# Patient Record
Sex: Male | Born: 1935 | Race: White | Hispanic: No | Marital: Married
Health system: Southern US, Community
[De-identification: ages and names within clinical notes are randomized; demographics above are authoritative.]

---

## 2003-05-14 ENCOUNTER — Other Ambulatory Visit: Payer: Self-pay

## 2003-08-10 ENCOUNTER — Other Ambulatory Visit: Payer: Self-pay

## 2003-11-05 ENCOUNTER — Other Ambulatory Visit: Payer: Self-pay

## 2004-03-04 ENCOUNTER — Ambulatory Visit: Payer: Self-pay | Admitting: Nurse Practitioner

## 2004-09-10 ENCOUNTER — Ambulatory Visit: Payer: Self-pay

## 2004-09-18 ENCOUNTER — Ambulatory Visit: Payer: Self-pay

## 2006-05-10 ENCOUNTER — Other Ambulatory Visit: Payer: Self-pay

## 2006-05-10 ENCOUNTER — Emergency Department: Payer: Self-pay | Admitting: Emergency Medicine

## 2006-05-29 ENCOUNTER — Other Ambulatory Visit: Payer: Self-pay

## 2006-05-29 ENCOUNTER — Inpatient Hospital Stay: Payer: Self-pay | Admitting: Specialist

## 2006-05-31 ENCOUNTER — Other Ambulatory Visit: Payer: Self-pay

## 2006-06-14 ENCOUNTER — Other Ambulatory Visit: Payer: Self-pay

## 2006-06-14 ENCOUNTER — Inpatient Hospital Stay: Payer: Self-pay | Admitting: Specialist

## 2009-08-27 ENCOUNTER — Emergency Department: Payer: Self-pay | Admitting: Emergency Medicine

## 2010-06-17 ENCOUNTER — Ambulatory Visit: Payer: Self-pay | Admitting: Internal Medicine

## 2010-07-07 ENCOUNTER — Inpatient Hospital Stay: Payer: Self-pay | Admitting: Internal Medicine

## 2010-07-10 ENCOUNTER — Encounter: Payer: Self-pay | Admitting: Internal Medicine

## 2010-07-18 ENCOUNTER — Ambulatory Visit: Payer: Self-pay | Admitting: Internal Medicine

## 2010-07-18 ENCOUNTER — Encounter: Payer: Self-pay | Admitting: Internal Medicine

## 2010-08-17 ENCOUNTER — Encounter: Payer: Self-pay | Admitting: Internal Medicine

## 2010-09-17 ENCOUNTER — Encounter: Payer: Self-pay | Admitting: Internal Medicine

## 2010-12-03 ENCOUNTER — Inpatient Hospital Stay: Payer: Self-pay | Admitting: Internal Medicine

## 2011-05-07 ENCOUNTER — Inpatient Hospital Stay: Payer: Self-pay | Admitting: Internal Medicine

## 2011-05-07 LAB — COMPREHENSIVE METABOLIC PANEL
Albumin: 4 g/dL (ref 3.4–5.0)
Alkaline Phosphatase: 58 U/L (ref 50–136)
Anion Gap: 13 (ref 7–16)
Bilirubin,Total: 1.2 mg/dL — ABNORMAL HIGH (ref 0.2–1.0)
Chloride: 97 mmol/L — ABNORMAL LOW (ref 98–107)
Co2: 28 mmol/L (ref 21–32)
Creatinine: 0.98 mg/dL (ref 0.60–1.30)
EGFR (African American): >60
Osmolality: 278 (ref 275–301)
Potassium: 4 mmol/L (ref 3.5–5.1)
SGOT(AST): 43 U/L — ABNORMAL HIGH (ref 15–37)
Sodium: 138 mmol/L (ref 136–145)

## 2011-05-07 LAB — CBC
Platelet: 135 10*3/uL — ABNORMAL LOW (ref 150–440)
RBC: 5.01 10*6/uL (ref 4.40–5.90)
RDW: 13 % (ref 11.5–14.5)
WBC: 14.9 10*3/uL — ABNORMAL HIGH (ref 3.8–10.6)

## 2011-05-07 LAB — URINALYSIS, COMPLETE
Bacteria: NONE SEEN
Bilirubin,UR: NEGATIVE
Glucose,UR: NEGATIVE mg/dL (ref 0–75)
Leukocyte Esterase: NEGATIVE
Nitrite: NEGATIVE
Ph: 5 (ref 4.5–8.0)
Protein: 30
RBC,UR: 5 /HPF (ref 0–5)
Specific Gravity: 1.014 (ref 1.003–1.030)
Squamous Epithelial: NONE SEEN
WBC UR: 2 /HPF (ref 0–5)

## 2011-05-07 LAB — TROPONIN I: Troponin-I: 0.02 ng/mL

## 2011-05-09 LAB — CBC WITH DIFFERENTIAL/PLATELET
Basophil #: 0 10*3/uL (ref 0.0–0.1)
Basophil %: 0 %
Eosinophil %: 0.1 %
HCT: 42.6 % (ref 40.0–52.0)
HGB: 14.2 g/dL (ref 13.0–18.0)
Lymphocyte #: 0.4 10*3/uL — ABNORMAL LOW (ref 1.0–3.6)
Lymphocyte %: 3.4 %
MCH: 32.8 pg (ref 26.0–34.0)
MCV: 98 fL (ref 80–100)
Monocyte %: 7.2 %
Neutrophil #: 11.2 10*3/uL — ABNORMAL HIGH (ref 1.4–6.5)
Platelet: 141 10*3/uL — ABNORMAL LOW (ref 150–440)
RBC: 4.33 10*6/uL — ABNORMAL LOW (ref 4.40–5.90)
RDW: 12.9 % (ref 11.5–14.5)
WBC: 12.5 10*3/uL — ABNORMAL HIGH (ref 3.8–10.6)

## 2011-05-13 LAB — CULTURE, BLOOD (SINGLE)

## 2011-05-22 ENCOUNTER — Inpatient Hospital Stay: Payer: Self-pay | Admitting: Student

## 2011-05-22 LAB — URINALYSIS, COMPLETE
Bilirubin,UR: NEGATIVE
Glucose,UR: NEGATIVE mg/dL (ref 0–75)
Leukocyte Esterase: NEGATIVE
Ph: 6 (ref 4.5–8.0)
Protein: NEGATIVE
Specific Gravity: 1.019 (ref 1.003–1.030)
Squamous Epithelial: <1

## 2011-05-22 LAB — CBC
HCT: 53.8 % — ABNORMAL HIGH (ref 40.0–52.0)
HGB: 17.8 g/dL (ref 13.0–18.0)
MCH: 32.2 pg (ref 26.0–34.0)
MCHC: 33 g/dL (ref 32.0–36.0)
MCHC: 33.3 g/dL (ref 32.0–36.0)
MCV: 98 fL (ref 80–100)
Platelet: 149 10*3/uL — ABNORMAL LOW (ref 150–440)
Platelet: 141 10*3/uL — ABNORMAL LOW (ref 150–440)
RBC: 5.51 10*6/uL (ref 4.40–5.90)
RBC: 5.03 10*6/uL (ref 4.40–5.90)
RDW: 12.4 % (ref 11.5–14.5)
RDW: 13.2 % (ref 11.5–14.5)
WBC: 13 10*3/uL — ABNORMAL HIGH (ref 3.8–10.6)

## 2011-05-22 LAB — DIFFERENTIAL
Basophil #: 0 10*3/uL (ref 0.0–0.1)
Basophil %: 0 %
Eosinophil %: 0.6 %
Lymphocyte %: 4.3 %
Monocyte #: 0.9 10*3/uL — ABNORMAL HIGH (ref 0.0–0.7)

## 2011-05-22 LAB — COMPREHENSIVE METABOLIC PANEL
Albumin: 3.3 g/dL — ABNORMAL LOW (ref 3.4–5.0)
Alkaline Phosphatase: 86 U/L (ref 50–136)
BUN: 21 mg/dL — ABNORMAL HIGH (ref 7–18)
Calcium, Total: 8.8 mg/dL (ref 8.5–10.1)
Co2: 29 mmol/L (ref 21–32)
Creatinine: 0.82 mg/dL (ref 0.60–1.30)
EGFR (African American): >60
EGFR (Non-African Amer.): >60
Glucose: 98 mg/dL (ref 65–99)
Osmolality: 282 (ref 275–301)
Sodium: 140 mmol/L (ref 136–145)
Total Protein: 7 g/dL (ref 6.4–8.2)

## 2011-05-22 LAB — TROPONIN I: Troponin-I: 0.25 ng/mL — ABNORMAL HIGH

## 2011-05-22 LAB — CK TOTAL AND CKMB (NOT AT ARMC)
CK, Total: 99 U/L (ref 35–232)
CK-MB: 2.6 ng/mL (ref 0.5–3.6)

## 2011-05-22 LAB — PRO B NATRIURETIC PEPTIDE: B-Type Natriuretic Peptide: 856 pg/mL — ABNORMAL HIGH (ref 0–450)

## 2011-05-23 LAB — BASIC METABOLIC PANEL
Anion Gap: 12 (ref 7–16)
BUN: 26 mg/dL — ABNORMAL HIGH (ref 7–18)
Calcium, Total: 8.9 mg/dL (ref 8.5–10.1)
Chloride: 102 mmol/L (ref 98–107)
EGFR (African American): >60
EGFR (Non-African Amer.): >60
Glucose: 147 mg/dL — ABNORMAL HIGH (ref 65–99)
Osmolality: 287 (ref 275–301)

## 2011-05-23 LAB — CBC WITH DIFFERENTIAL/PLATELET
Basophil #: 0 10*3/uL (ref 0.0–0.1)
Basophil %: 0.1 %
Eosinophil #: 0 10*3/uL (ref 0.0–0.7)
Eosinophil %: 0 %
HCT: 47.7 % (ref 40.0–52.0)
HGB: 16 g/dL (ref 13.0–18.0)
MCHC: 33.5 g/dL (ref 32.0–36.0)
MCV: 98 fL (ref 80–100)
Monocyte #: 0.1 10*3/uL (ref 0.0–0.7)
Neutrophil #: 8.4 10*3/uL — ABNORMAL HIGH (ref 1.4–6.5)
Neutrophil %: 96.6 %
Platelet: 141 10*3/uL — ABNORMAL LOW (ref 150–440)
RBC: 4.88 10*6/uL (ref 4.40–5.90)

## 2011-05-23 LAB — TROPONIN I: Troponin-I: 0.07 ng/mL — ABNORMAL HIGH

## 2011-05-27 LAB — BASIC METABOLIC PANEL
Anion Gap: 7 (ref 7–16)
BUN: 34 mg/dL — ABNORMAL HIGH (ref 7–18)
Co2: 31 mmol/L (ref 21–32)
EGFR (Non-African Amer.): >60
Glucose: 123 mg/dL — ABNORMAL HIGH (ref 65–99)

## 2011-05-28 LAB — CULTURE, BLOOD (SINGLE)

## 2011-05-30 LAB — CBC WITH DIFFERENTIAL/PLATELET
Eosinophil #: 0 10*3/uL (ref 0.0–0.7)
Eosinophil %: 0.1 %
HCT: 51.6 % (ref 40.0–52.0)
Lymphocyte %: 2.1 %
MCH: 32.1 pg (ref 26.0–34.0)
MCHC: 32.8 g/dL (ref 32.0–36.0)
MCV: 98 fL (ref 80–100)
Monocyte #: 0.9 x10 3/mm (ref 0.2–1.0)
Monocyte %: 6 %
Neutrophil %: 91.7 %
RDW: 12.9 % (ref 11.5–14.5)
WBC: 14.6 10*3/uL — ABNORMAL HIGH (ref 3.8–10.6)

## 2011-05-30 LAB — BASIC METABOLIC PANEL
Anion Gap: 8 (ref 7–16)
BUN: 37 mg/dL — ABNORMAL HIGH (ref 7–18)
Calcium, Total: 8.7 mg/dL (ref 8.5–10.1)
Chloride: 103 mmol/L (ref 98–107)
Creatinine: 0.94 mg/dL (ref 0.60–1.30)
EGFR (Non-African Amer.): >60
Osmolality: 291 (ref 275–301)
Sodium: 141 mmol/L (ref 136–145)

## 2011-08-17 DEATH — deceased

## 2013-03-17 IMAGING — CR DG CHEST 1V PORT
1 series · 2 of 2 positions shown · non-contrast
Comparison: none

REASON FOR EXAM: sob
COMMENTS:

[Series 1: portable · 0.17mm/px · 2 of 2 slices shown]
[im 1/2]
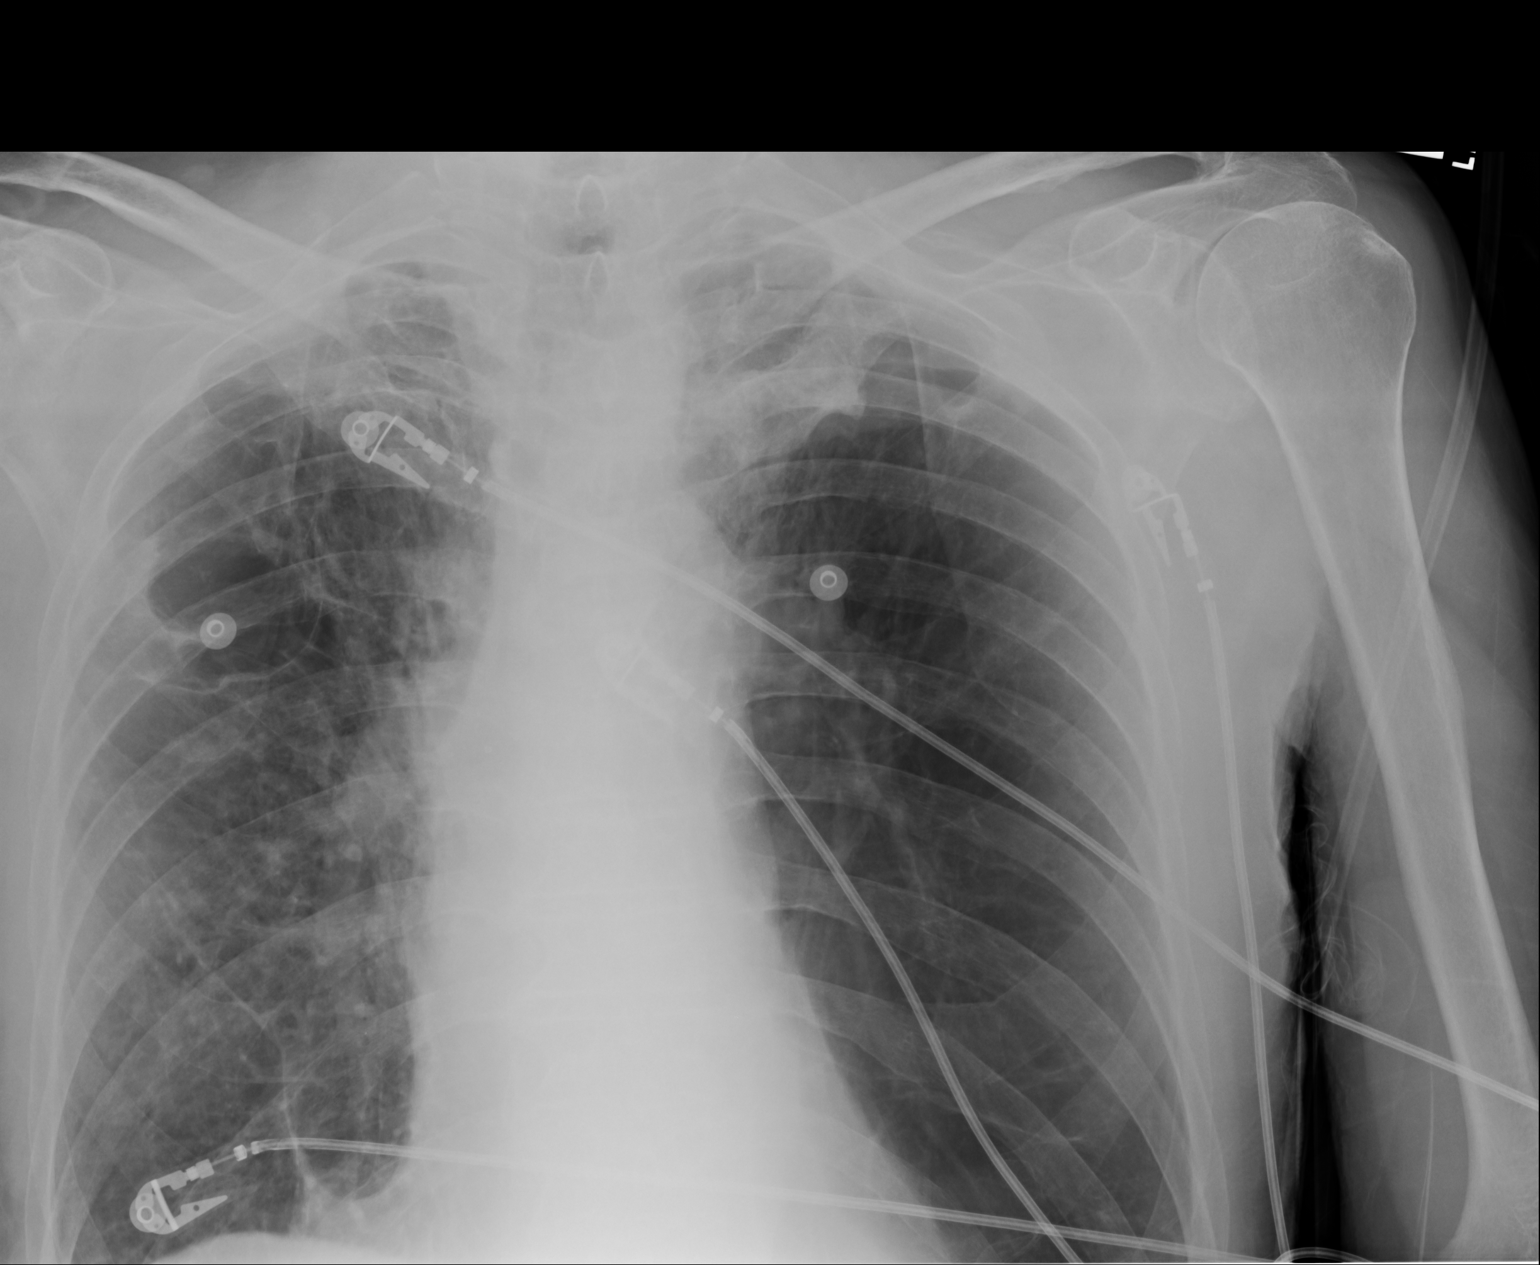
[im 2/2]
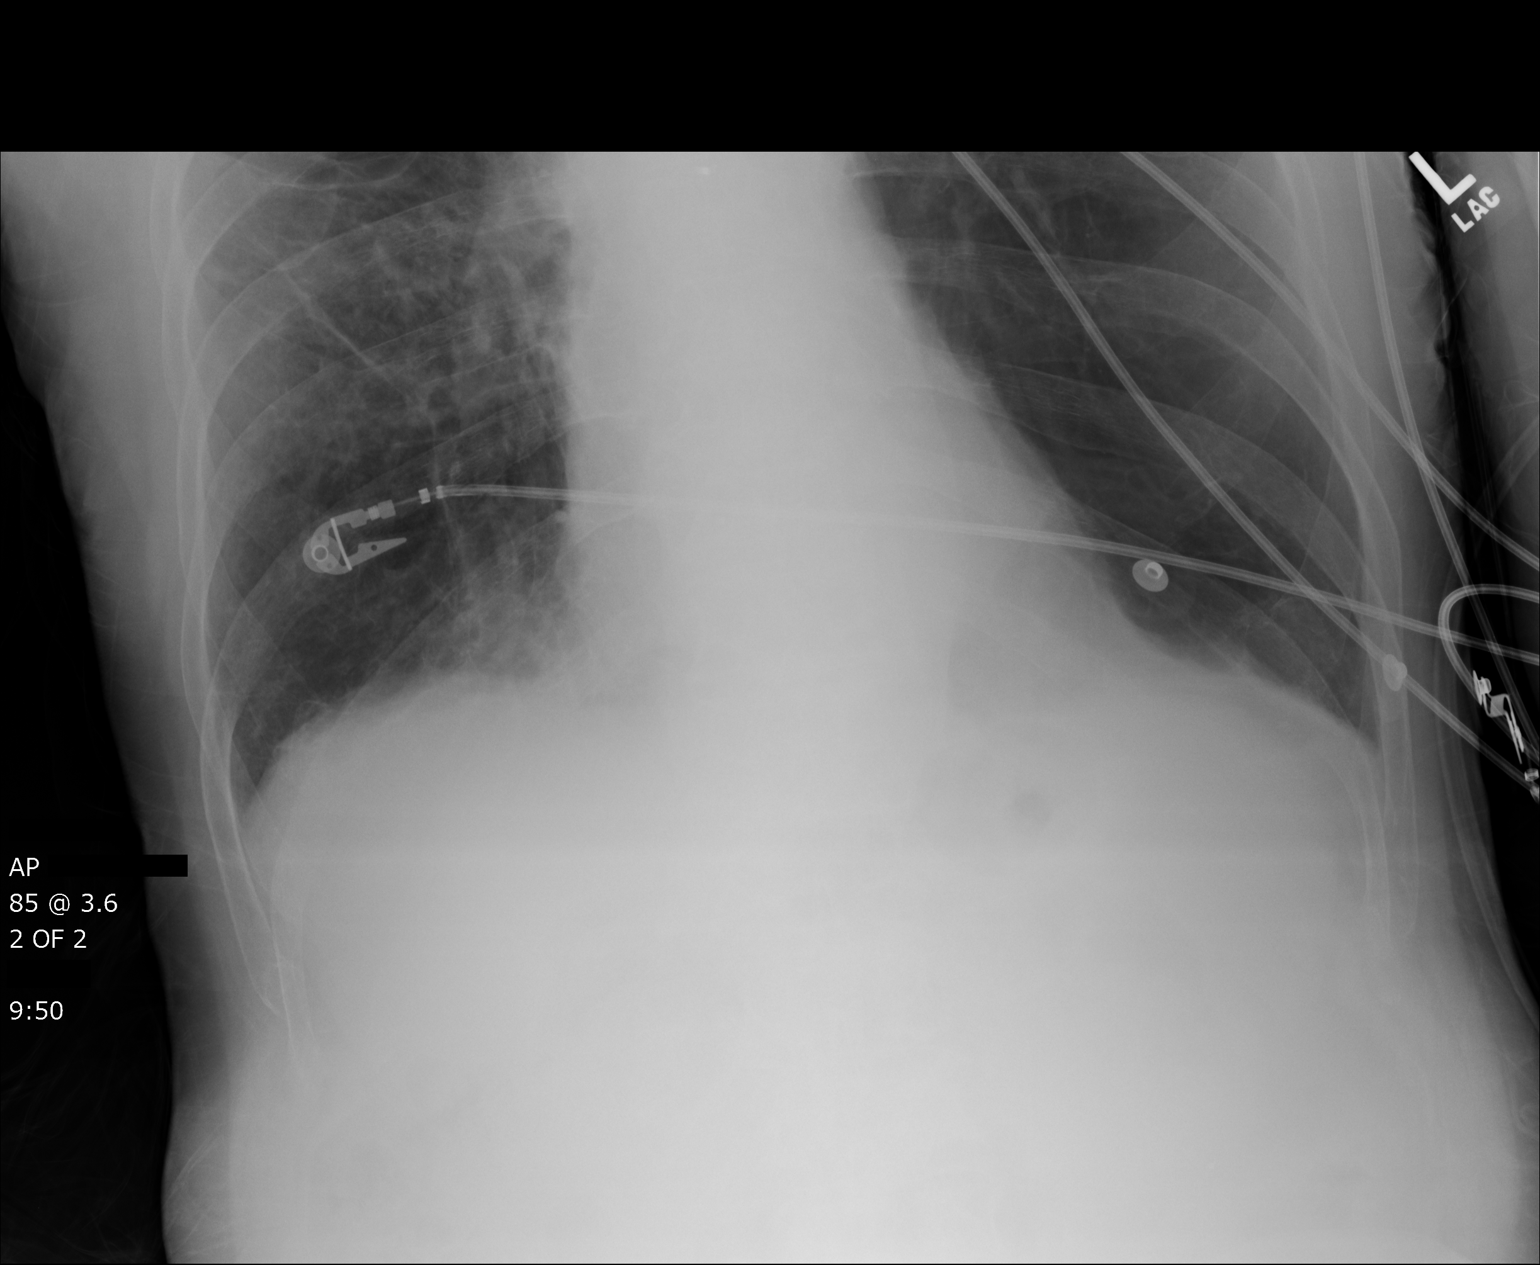

[2 of 2 positions shown; findings below may reference images not displayed]

PROCEDURE:     DXR - DXR PORTABLE CHEST SINGLE VIEW  - May 22, 2011  [DATE]

RESULT:

Eczematous change is identified within the right and left lungs as well as
bullous changes in the right lung apex. There is prominence of the
interstitial markings. No focal regions of consolidation are identified.
This study was compared to a previous study dated 05/07/2011. Cardiac
silhouette and visualized bony skeleton are unremarkable.
IMPRESSION: 1.     Emphysematous and likely fibrotic changes within the lungs.
2.     No focal regions of consolidation are appreciated.

## 2013-03-18 IMAGING — CR DG CHEST 1V PORT
1 series · 1 of 1 positions shown · non-contrast
Comparison: none

REASON FOR EXAM: resp failure
COMMENTS:

PROCEDURE:     DXR - DXR PORTABLE CHEST SINGLE VIEW  - May 23, 2011  [DATE]
RESULT:     Comparison: 05/22/2011

[portable]
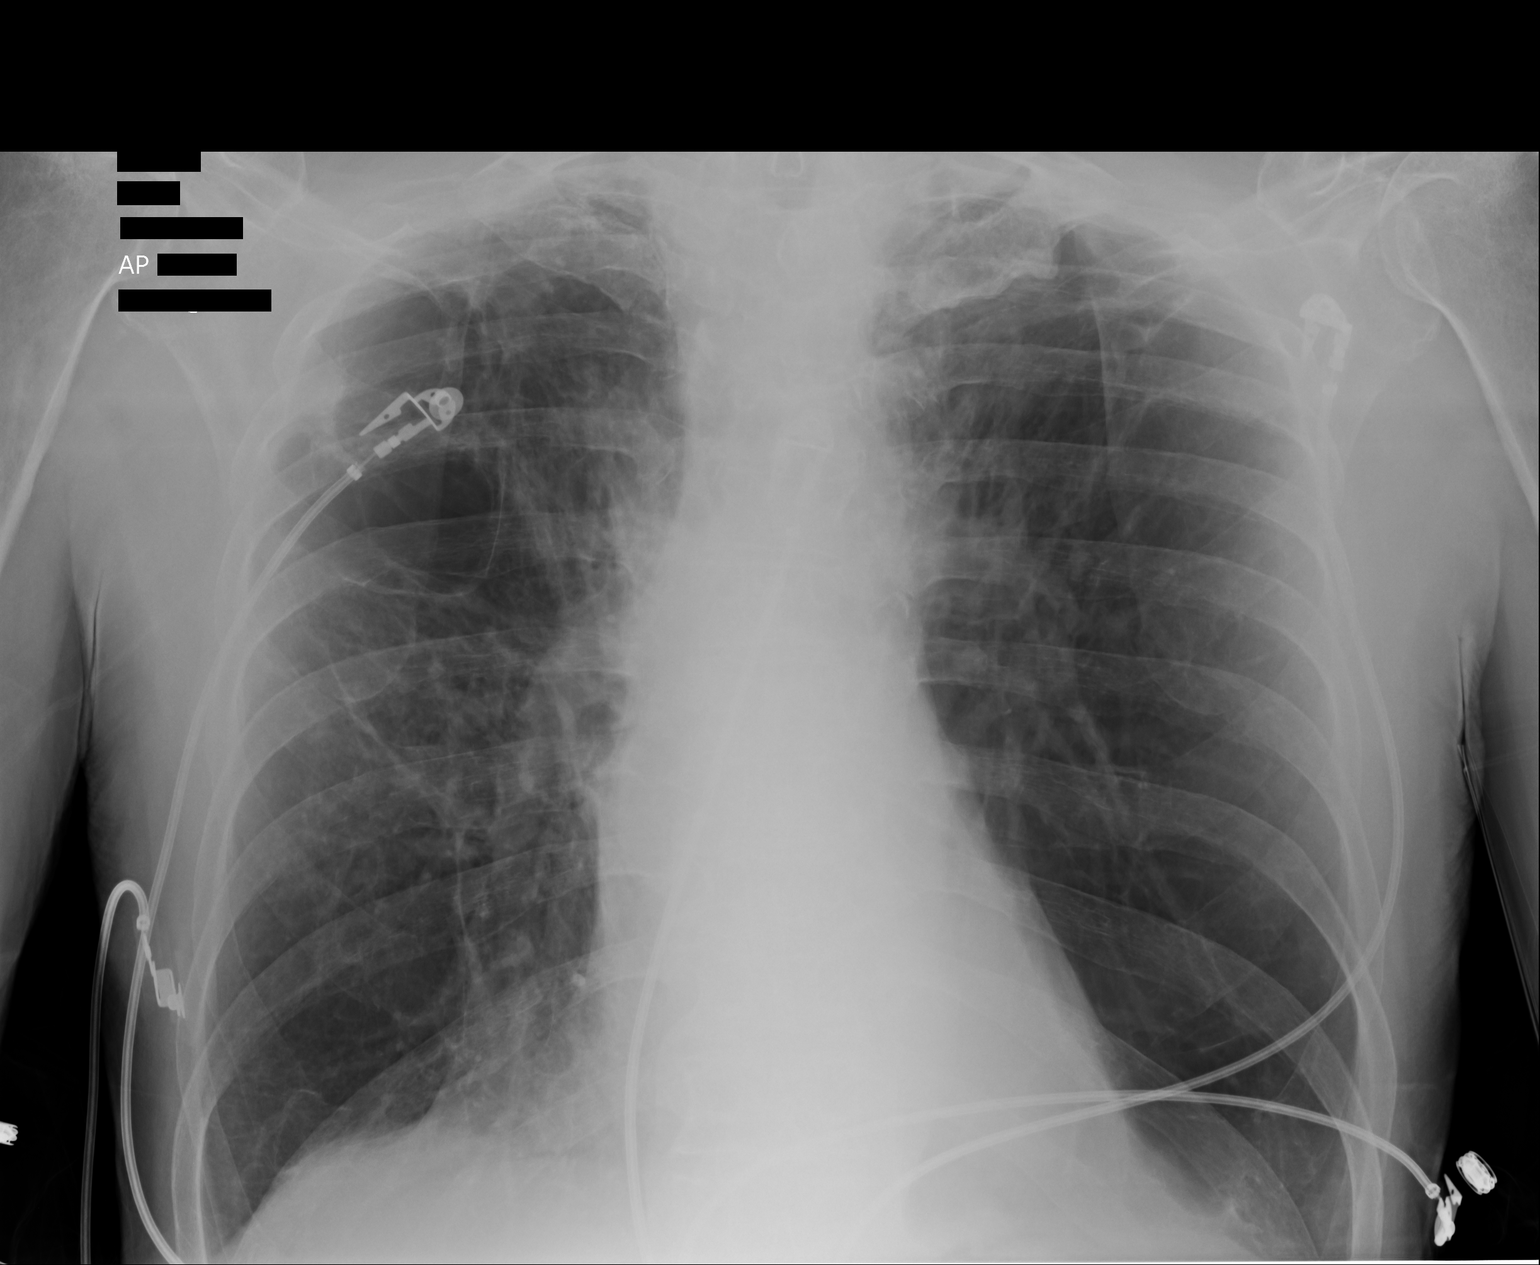

[1 of 1 positions shown; findings below may reference images not displayed]

FINDINGS: The heart and mediastinum are stable. The lungs are hyperinflated. Bullous
changes in the right upper lung are similar to prior. Reticular opacities in
the right lung are decreased from prior. There is a small nodular density at
the periphery the right upper lobe which is similar to recent prior
radiographs, but more conspicuous then more remote prior radiographs.
IMPRESSION: 1. Decreased interstitial opacities in the right lung may represent
decreasing asymmetric pulmonary edema or atypical infection.
2. Emphysema with bullous changes in the right upper lobe. Nodular density
at the periphery right upper lobe is more conspicuous than more remote prior
chest radiographs. Recommend short term follow radiographs to ensure
resolution. If this finding persists, further evaluation with chest CT would
be recommended to evaluate for malignancy.

## 2013-03-24 IMAGING — CT CT CHEST W/ CM
1 series · 15 of 32 positions shown, 19 images · IV contrast (APPLIED)
Comparison: 07/08/2010

REASON FOR EXAM: copd not improving, shortness of breath
COMMENTS:

PROCEDURE:     CT  - CT CHEST (FOR PE) W  - May 29, 2011 [DATE]
RESULT:     Indications: Shortness of breath
TECHNIQUE: A thin-section spiral CT from the lung apices to the upper
abdomen was acquired on a multi slice scanner following 100ml Psovue-NV1
intravenous contrast. These images were then transferred to the Siemens work
station and were subsequently reviewed utilizing 3-D reconstructions and MIP
images.

[Series 4: soft tissue · axial · 0.74mm/px · z∈[-379,-88]mm · 15 of 111 slices shown, 19 images]
[im 9/111  mediastinal]
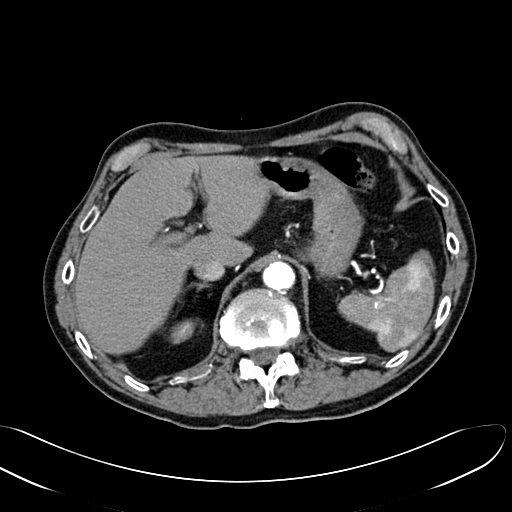
[im 9/111  lung]
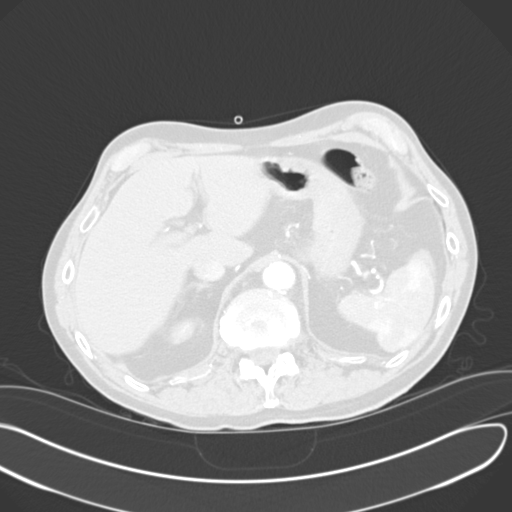
[im 17/111  lung]
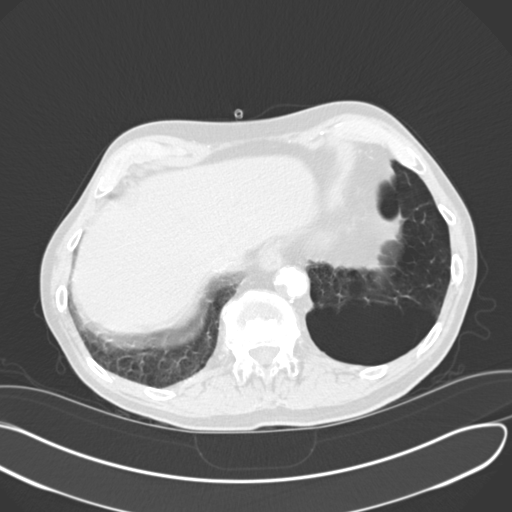
[im 23/111  lung]
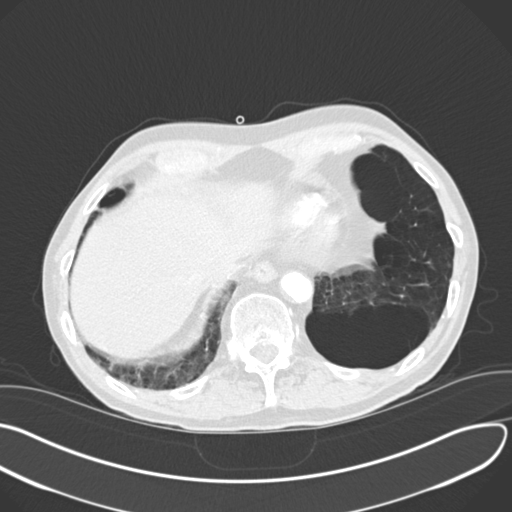
[im 29/111  lung]
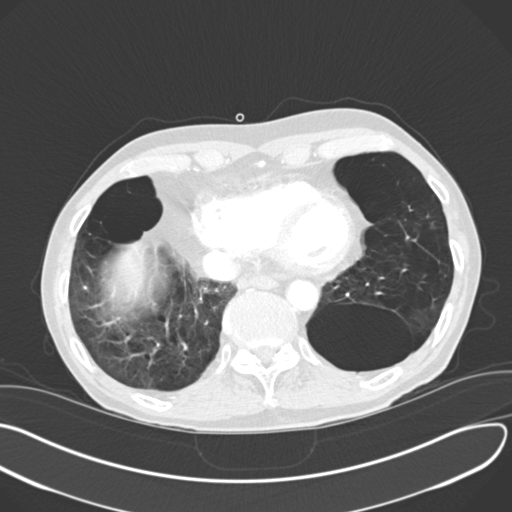
[im 37/111  mediastinal]
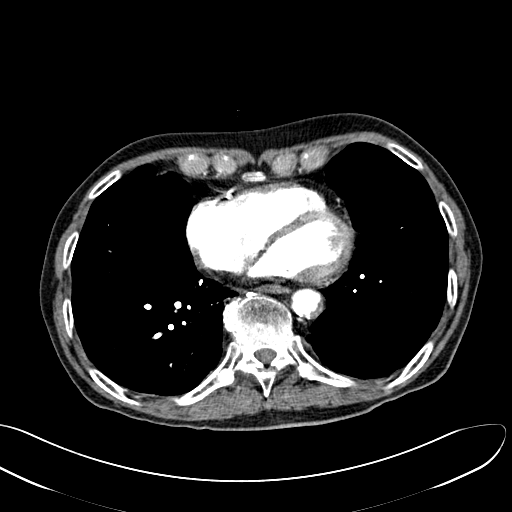
[im 37/111  lung]
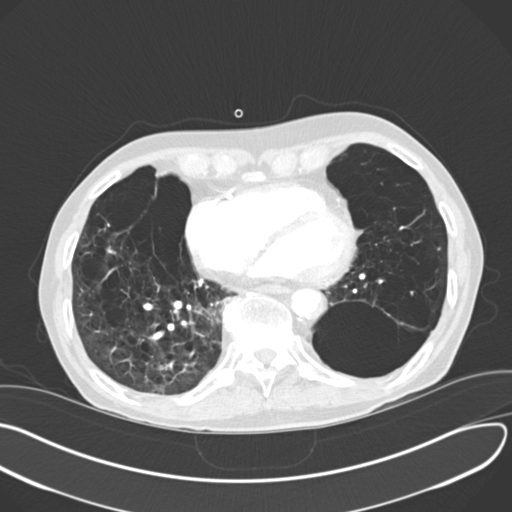
[im 45/111  lung]
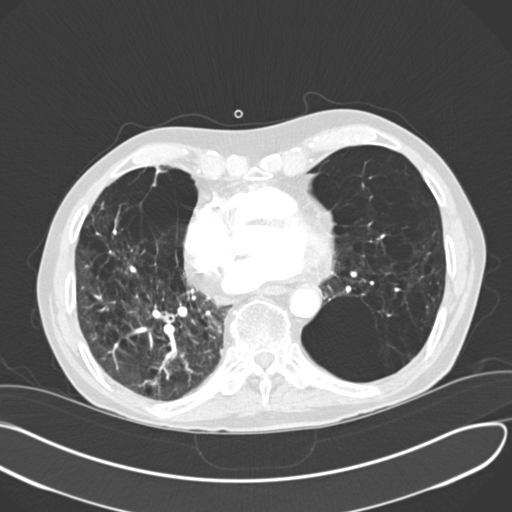
[im 53/111  lung]
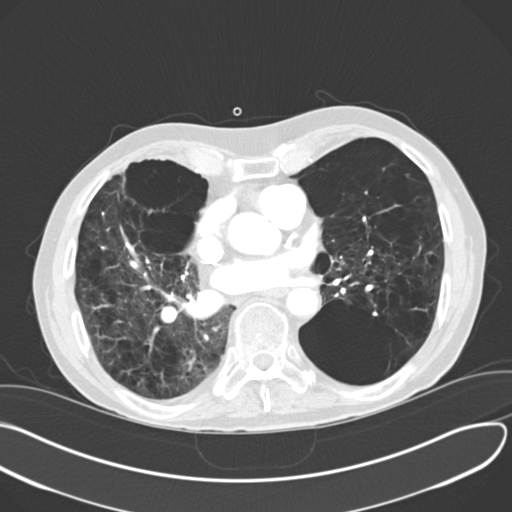
[im 59/111  lung]
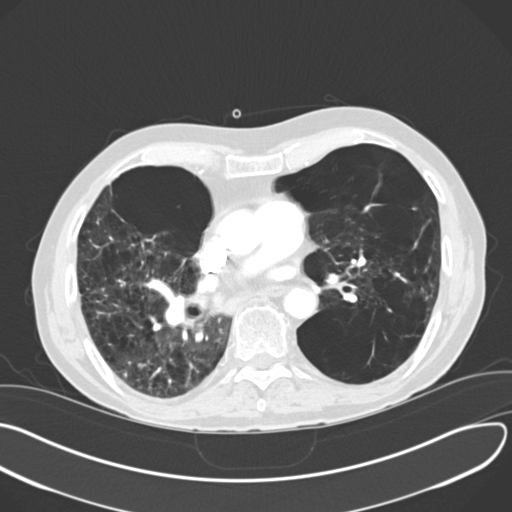
[im 66/111  mediastinal]
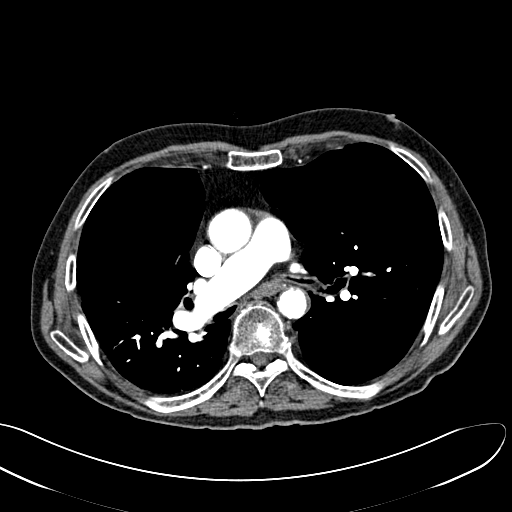
[im 66/111  lung]
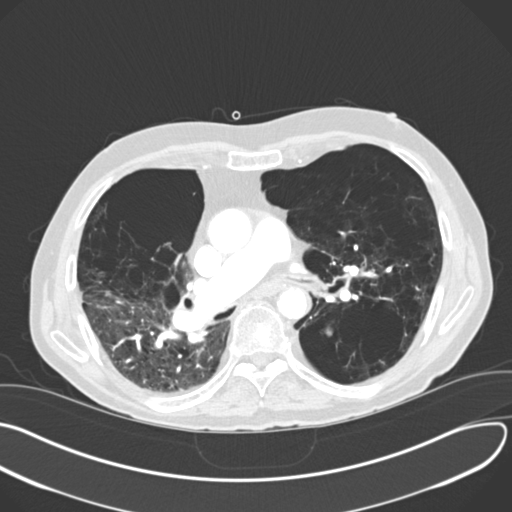
[im 70/111  lung]
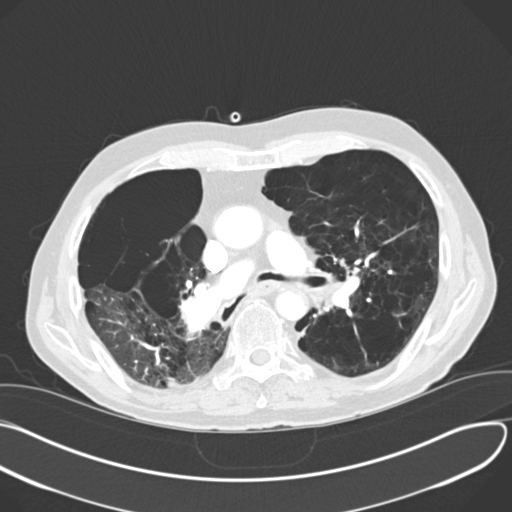
[im 78/111  lung]
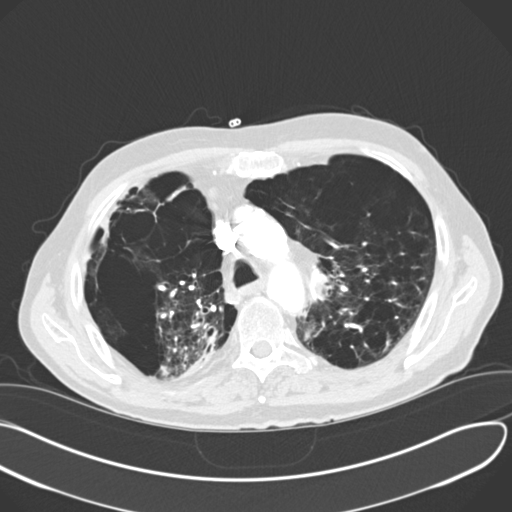
[im 86/111  lung]
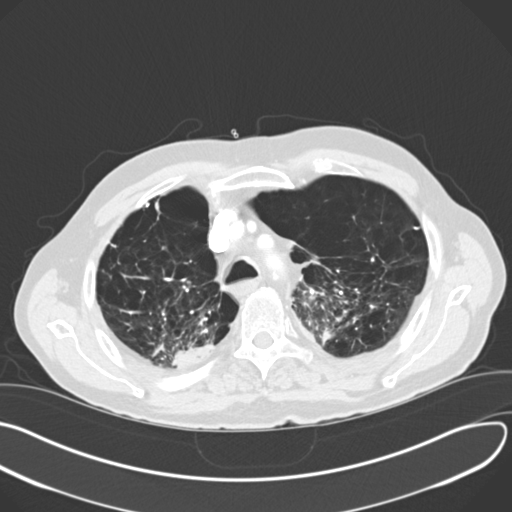
[im 90/111  mediastinal]
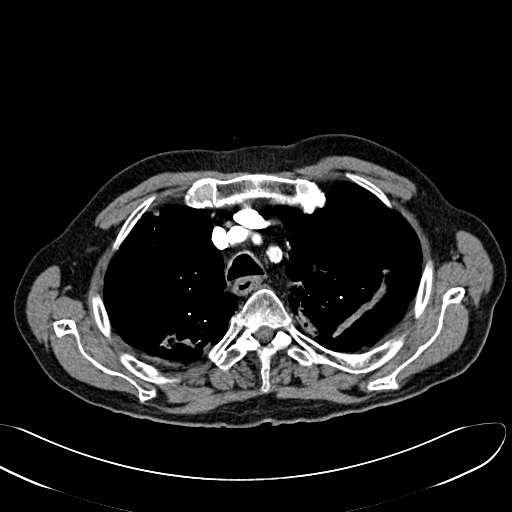
[im 90/111  lung]
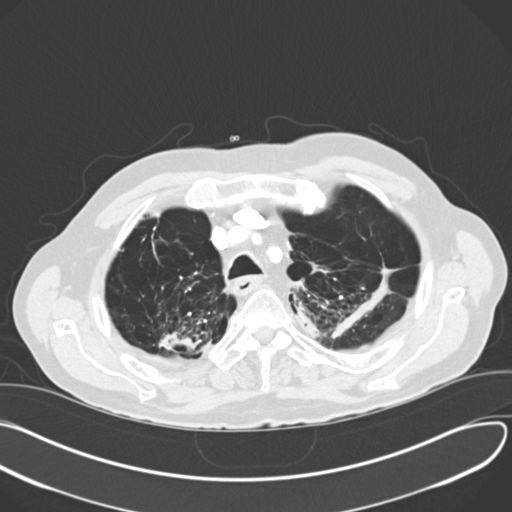
[im 98/111  lung]
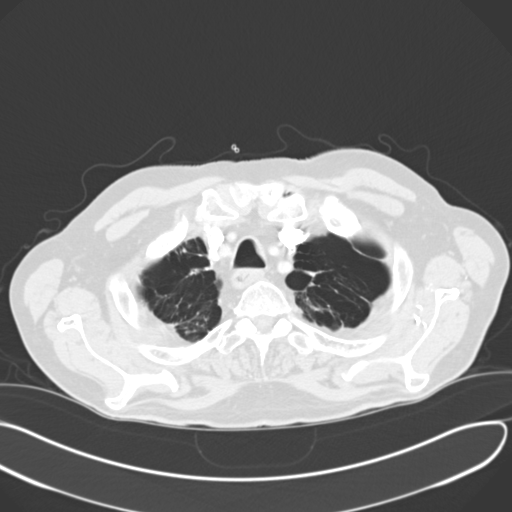
[im 106/111  lung]
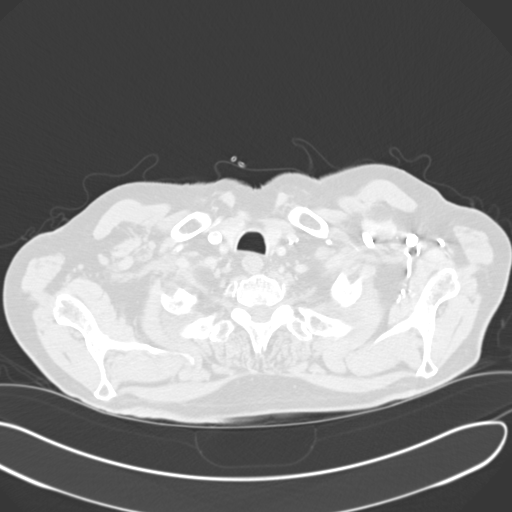

[15 of 32 positions shown; findings below may reference images not displayed]

FINDINGS: There is adequate opacification of the pulmonary arteries. There is no
pulmonary embolus. The main pulmonary artery, right main pulmonary artery,
and left main pulmonary arteries are normal in size. The heart size is
normal. There is no pericardial effusion.

There are severe bilateral emphysematous changes. There is bullous disease
at bilateral lung apices. There is biapical scarring. There is a 12 mm left
lower lobe pulmonary nodule. There is no focal consolidation, pleural
effusion, or pneumothorax.

There is no axillary, hilar, or mediastinal adenopathy.

The osseous structures are unremarkable.

The visualized portions of the upper abdomen are unremarkable.
IMPRESSION: 1. No CT evidence of pulmonary embolus.

2. Severe bilateral emphysematous changes.

3. Stable 12 mm left lower lobe pulmonary nodule unchanged compared with
09/10/2004.

## 2014-06-10 NOTE — Discharge Summary (Signed)
PATIENT NAME:  Eugene, Espinoza MR#:  [id number] DATE OF BIRTH:  May 16, 1931  DATE OF ADMISSION:  05/22/2011 DATE OF DISCHARGE:  05/31/2011  CONSULTANTS:  1. Dr. Darrold Junker from cardiology.  2. Physical therapy.  3. Dr. Gwen Pounds from cardiology.  4. Dr. Meredeth Ide from pulmonary.   CHIEF COMPLAINT: Shortness of breath.   DISCHARGE DIAGNOSES:  1. Acute on chronic respiratory failure secondary to chronic obstructive pulmonary disease/emphysema and bronchitis.  2. Elevated troponin, likely demand ischemia.  3. Hyperlipidemia.  4. History of peptic ulcer disease.  5. Anxiety.  6. Coronary artery disease.  7. Pulmonary nodule which is stable.  8. Hyperglycemia likely in the setting of steroids.  9. Insomnia.   DISCHARGE MEDICATIONS:  1. Fluoxetine 20 mg daily.  2. Simvastatin 20 mg daily.  3. Omeprazole 20 mg daily.  4. Flomax 0.4 mg daily.  5. Aspirin 81 mg daily.  6. Advair 250/50 micrograms 1 puff inhaled twice a day.  7. Fexofenadine 180 mg daily.  8. Flonase 50 mcg two sprays in nose once a day.  9. Tylenol every six hours 650 mg as needed for pain.  10. Ventolin HFA 2 puffs inhaled four times a day as needed.  11. Alprazolam 1 mg 1 tab 2 times a day and at that time if needed.  12. Zofran 4 mg every six hours as needed.  13. Spiriva 18 mcg inhaled one cap daily.  14. Prednisone 40 mg daily for two days, then taper down by 10 mg until you reach 10 mg and then take that indefinitely until you are seen by Dr. Meredeth Ide your pulmonologist.  15. Daliresp 500 mcg once a day.   The patient will be going home on 4 liters of oxygen with RN, PT, and an aide if it could be set up.Marland Kitchen   DIET: Low sodium, ADA diet.   ACTIVITY: As tolerated.   FOLLOWUP: Please follow-up with your PCP and Dr. Meredeth Ide your lung doctor within one week.   DISPOSITION: To home.   HISTORY OF PRESENT ILLNESS: This dictation should cover from April 13th until April 14th. For previous hospital course, please see  the history and physical dictated on April 5th and interim summary dictated on April 12th by Dr. Renae Gloss.   HOSPITAL COURSE: The patient's shortness of breath has significantly improved. He is on 4 liters nasal cannula. He had a CT of the chest with PE protocol done on April 12th which did not show any evidence for PE. There is severe bilateral emphysematous changes noted. There is no evidence for pneumonia or pericardial effusions. There was a 12 mm left lower lobe pulmonary nodule which appears to be stable. There is no consolidation, pleural effusions, or pneumothorax either. He was a slow responder to the treatment. As he had no significant infiltrate, his antibiotics consisting of Levaquin and Zyvox were discontinued. He has been on Daliresp 250 mcg daily since the 8th and the patient has been tolerating. Per my conversation with Dr. Meredeth Ide we can bump this up to 1 tab daily. Additionally, Spiriva would be started for the patient as an outpatient. He will be discharged on his Advair and 4 liters nasal cannula. He does have some cough, however he has some cough medicine including Mucinex at home. He was seen by physical therapy and home with home health aide were recommended. I have discussed the case with case management and we would also attempt to get him an aide. He has decreased mobility as he has an almost  like an end-stage chronic obstructive pulmonary disease/emphysema. He did have some loose stools on the 13th of April and has been tapering. It was tested negative for Clostridium difficile.   TOTAL TIME SPENT: 40 minutes.   CODE STATUS: The patient is a DO NOT RESUSCITATE.   ____________________________ Eugene EatonShayiq Darianne Muralles, MD sa:rbg D: 05/31/2011 13:10:45 ET T: 06/02/2011 09:28:44 ET JOB#: [id number]  cc: Eugene EatonShayiq Jovanka Westgate, MD, <Dictator> Herbon E. Meredeth IdeFleming, MD Dr. Elray McgregorWest Kenae Lindquist [facility] CenterHMADZIA MD ELECTRONICALLY SIGNED 06/05/2011 12:44

## 2014-06-10 NOTE — Consult Note (Signed)
PATIENT NAME:  Eugene Espinoza, Eugene Espinoza MR#:  [id number] DATE OF BIRTH:  [birth year]  DATE OF CONSULTATION:  05/23/2011  REFERRING PHYSICIAN:  Enedina FinnerSona Patel, MD CONSULTING PHYSICIAN:  Marcina MillardAlexander Adreena Willits, MD  PRIMARY CARE PHYSICIAN: Ned ClinesHerbon Fleming, MD  CHIEF COMPLAINT: Shortness of breath.   HISTORY OF PRESENT ILLNESS: The patient is a 81 year old gentleman referred for evaluation of borderline elevated troponin. The patient has known chronic obstructive pulmonary disease, followed by Dr. Meredeth IdeFleming. The patient was recently residing at [facility] where he has been experiencing increasing nonproductive cough and shortness of breath. The patient was brought [facility] where he was noted to be tachypneic, tachycardic, and in respiratory failure, and was placed on BiPAP. The patient was admitted to the Critical Care Unit where he has clinically improved. The patient denies chest pain. He has borderline elevated troponin of 0.25. Total CK and MB were 88 and 2.6, respectively. EKG was nondiagnostic showing pulmonary disease pattern with evidence of right ventricular hypertrophy and right atrial enlargement.   PAST MEDICAL HISTORY:  1. End-stage chronic obstructive pulmonary disease, on 4 liters of oxygen.  2. Hyperlipidemia.  3. Anxiety.  4. Gastroesophageal reflux disease.  5. History of coronary artery disease status post stent several years ago.   MEDICATIONS: 1. Aspirin 81 mg daily.  2. Simvastatin 20 mg daily.  3. Advair 250/50 INH twice a day  4. Albuterol inhalation every 4 hours p.r.n.  5. Alprazolam 1 mg p.o. twice a day p.r.n.  6. Fexofenadine 180 mg daily.  7. Flomax 0.4 mg daily.  8. Flonase 50 mcg two sprays daily.  9. Omeprazole 20 mg daily.  10. Paroxetine 20 mg daily.  11. Prednisone 10 mg daily.  12. Ventolin 2 puffs p.r.n. 13. Zofran 4 mg every 6 hours p.r.n.   SOCIAL HISTORY: The patient is married. He currently resides at [facility]. He quit tobacco abuse 20 years ago.   FAMILY HISTORY: Father with history of coronary artery disease.   REVIEW OF SYSTEMS: CONSTITUTIONAL: No fever or chills. EYES: No blurry vision. EARS: No hearing loss. RESPIRATORY: Shortness of breath as described above. CARDIOVASCULAR: The patient denies chest pain. GASTROINTESTINAL: No nausea, vomiting, diarrhea, or constipation. GU: No dysuria or hematuria. MUSCULOSKELETAL: No arthralgias or myalgias. NEUROLOGIC: No focal muscle weakness or numbness. PSYCHOLOGICAL: No depression or anxiety.   PHYSICAL EXAMINATION:   VITAL SIGNS: Blood pressure 101/54, pulse 90, respirations 24, temperature 96%.   HEENT: Pupils equal and reactive to light and accommodation.   NECK: Supple without thyromegaly.   LUNGS: Decreased breath sounds with expiratory wheezes.   CARDIOVASCULAR: Normal jugular venous pressure. Normal point of maximal impulse. Regular rate and rhythm. Normal S1 and S2. No appreciable gallop, murmur, or rub.   ABDOMEN: Soft and nontender. Pulses were intact bilaterally.   MUSCULOSKELETAL: Normal muscle tone.   NEUROLOGIC: The patient was alert and oriented x3. Motor and sensory both grossly intact.   IMPRESSION: This is a 81 year old gentleman with prior history of coronary artery disease status post coronary stent with end-stage chronic obstructive pulmonary disease who presents with respiratory failure. The patient denies chest pain. He does have borderline elevated troponin. EKG was nondiagnostic showing pulmonary disease pattern.   IMPRESSION: Elevated troponin likely due to demand/supply ischemia and not due to acute coronary syndrome.   RECOMMENDATIONS:  1. Agree with overall current therapy.  2. Would defer full dose anticoagulation.  3. Review 2-D echocardiogram.  4. No further cardiac noninvasive or invasive  evaluation at this time. ____________________________ Marcina Millard, MD ap:slb D: 05/23/2011 09:32:22  ET T: 05/23/2011 13:00:28 ET JOB#: [id number]  cc: Marcina Millard, MD, <Dictator> Marcina Millard MD ELECTRONICALLY SIGNED 06/03/2011 8:47

## 2014-06-10 NOTE — Consult Note (Signed)
PATIENT NAME:  Eugene Espinoza, Eugene Espinoza MR#:  [id number] DATE OF BIRTH:  July 25, 1931  DATE OF CONSULTATION:  05/26/2011  DAILY PROGRESS NOTE  SUBJECTIVE: The patient has done fairly well today without evidence of chest pain or cardiovascular event and the patient has had slight improvement of his symptoms of shortness of breath   His physical examination is unchanged from yesterday. Orders reviewed and updated. Laboratory results were reviewed. Radiology results reviewed. Vital signs reviewed.   The patient does have known coronary artery risk factors and acute respiratory infection with hypoxia and improvements with oxygen and antibiotics as well as prednisone. Elevated troponins are consistent with type II myocardial necrosis and demand ischemia with no current evidence of acute coronary syndrome. The patient has no evidence of congestive heart failure at this time. Would continue ambulating and improving his pulmonary functions and watching for any other cardiac symptoms as necessary.   ____________________________ Lamar BlinksBruce J. Saadiya Wilfong, MD bjk:rbg D: 05/27/2011 18:21:46 ET T: 05/28/2011 12:36:37 ET JOB#: [id number]  cc: Lamar BlinksBruce J. Keerat Denicola, MD, <Dictator> Lamar BlinksBRUCE J Smaran Gaus MD ELECTRONICALLY SIGNED 05/28/2011 13:18

## 2014-06-10 NOTE — H&P (Signed)
PATIENT NAME:  Eugene Espinoza, Eugene Espinoza MR#:  [id number] DATE OF BIRTH:  June 02, 1931  DATE OF ADMISSION:  05/22/2011  PRIMARY MD: Dr. Dala Dock   PULMONOLOGIST: Dr. Meredeth Ide   ED REFERRING DOCTOR: Dr. Dorothea Glassman    CHIEF COMPLAINT: Acute respiratory failure.   HISTORY OF PRESENT ILLNESS: The patient is a 81 year old white male with advanced bullous emphysematous lung disease who is chronically on 4 liters of nasal cannula who currently lives resides at [facility], has not felt well for the past few days. Yesterday he did not feel like eating. He has been having a cough which has been nonproductive. He was having a hard time getting the cough up. He also has been having some discomfort in the substernal area of his chest and felt that this was due to him not being able to bring up the cough. Earlier this morning around 3 a.m. he started having respiratory difficulties. He was brought to the ED. In the ED he was very tachypneic and tachycardic, had to be placed on BiPAP which he continues to be on. The patient, otherwise, has not felt feverish or any chills. He does not have any left-sided chest pain. Denies any palpitations. He has been nauseous but no vomiting. No abdominal pain. No urinary symptoms.   PAST MEDICAL HISTORY:  1. End-stage chronic obstructive pulmonary disease, on 4 liters, has bullous emphysema.  2. Anxiety.  3. Hypercholesterolemia.  4. Gastroesophageal reflux disease.  5. Seasonal allergies.  6. History of coronary artery disease, status post stent a few years prior.  PAST SURGICAL HISTORY: 1. Appendectomy. 2. Tonsillectomy.  ALLERGIES: Allergy to Bactrim, doxycycline, erythromycin.   CURRENT MEDICATIONS:  1. Advair 250/50 INH b.i.d.  2. Albuterol inhalation q.4 p.r.n.  3. Alprazolam 1 mg 1 tab p.o. b.i.d. at bedtime as needed. 4. Aspirin 81 one tab p.o. daily.  5. Fexofenadine 180 daily.  6. Flomax 0.4 daily.  7. Flonase 50 mcg two sprays nasally  once. 8. Omeprazole 20 daily.  9. Paroxetine 20 daily.  10. Prednisone 10 daily.  11. Simvastatin 20 daily.  12. Tylenol 650 q.6 p.r.n. for pain. 13. Ventolin 2 puffs inhalation as needed.  14. Zofran 4 mg q.6 p.r.n.   SOCIAL HISTORY: The patient used to smoke, quit about 20 years ago. Has total 35-pack-year of smoking. He currently resides in [facility]. No alcohol or drug use.   FAMILY HISTORY: Father had heart disease. Mother had TB.   REVIEW OF SYSTEMS: CONSTITUTIONAL: No fever. Complains of fatigue, weakness. No pain. No weight loss. No weight gain. EYES: No blurred or double vision. No pain. No redness. No inflammation. No glaucoma. No cataracts. ENT: No tinnitus. No ear pain. No hearing loss. Does have year round allergies. No epistaxis. No nasal discharge. No snoring. No postnasal drip. No sinus pain. No difficulty with swallowing. RESPIRATORY: Complains of nonproductive cough. No wheezing. No hemoptysis. Has chronic dyspnea. Has advanced COPD. No TB. CARDIOVASCULAR: Reports substernal discomfort but no chest pain per se. Denies any orthopnea or edema. No arrhythmia. No palpitations. No syncope. GI: Complains of some nausea but no vomiting, diarrhea, abdominal pain, hematemesis, melena, ulcer, gastroesophageal reflux disease, irritable bowel syndrome, jaundice, rectal bleeding, or changes in bowel habits. GU: Denies any dysuria, hematuria, renal calculus, or frequency. ENDOCRINE: Denies any polyuria, nocturia, or thyroid problems. Denies increase in sweating, heat or cold intolerance. HEME/LYMPH: Denies anemia, easy bruisability, or bleeding. SKIN: No acne. No rash. No changes in mole, hair or skin. MUSCULOSKELETAL: Denies any  pain in neck, back or shoulder. NEUROLOGIC: No numbness. No CVA. No TIA. No seizures. PSYCHIATRIC: No anxiety. No insomnia. No ADD. No OCD.   PHYSICAL EXAMINATION:   VITAL SIGNS: Temperature 99.1, pulse 123 on presentation, respirations 28, blood pressure 105/68,  97% initially on BiPAP.   HEENT: Head atraumatic, normocephalic. Pupils equally round and reactive to light and accommodation. Extraocular movements intact. Oropharynx is clear without any exudates. Nasal exam shows no drainage or ulceration. Ear exam externally shows no erythema or drainage.   NECK: No thyromegaly. No carotid bruits.   CARDIOVASCULAR: Regular rate and rhythm. No murmurs, rubs, clicks, or gallops. PMI is displaced.   LUNGS: He has diminished breath sounds throughout both lungs. There are no rales, rhonchi, or wheezing.   ABDOMEN: Soft, nontender, nondistended. Positive bowel sounds x4. There is no hepatosplenomegaly.   EXTREMITIES: No clubbing, cyanosis, or edema.   SKIN: No rash.   LYMPHATICS: No lymph nodes palpable.   VASCULAR: Good DP, PT pulses.   PSYCHIATRIC: Currently a little anxious because of the respiratory distress.   NEUROLOGIC: No focal deficits. Cranial nerves II to XII grossly intact.   SKIN: No rash.   MUSCULOSKELETAL: There is no erythema or swelling.   EVALUATION IN THE ED: WBC count 13.0, hemoglobin 17.8, platelet count 149. ABG pH 7.40, pCO2 47, pO2 444. This was on BiPAP. Troponin 0.25. WBC 12.3, hemoglobin 16.4. BMP glucose 98, BUN 21, creatinine 0.82, sodium 140, potassium 3.9, chloride 102, CO2 29, calcium 8.8. LFTs are normal. BNP 856. EKG has a lot of artifact, hard to interpret. There is some sinus tachycardia. Chest x-ray shows COPD changes; no acute abnormality.   ASSESSMENT AND PLAN: The patient is a 81 year old white male with advanced COPD who presents with acute respiratory failure currently requiring BiPAP.  1. Acute respiratory failure in patient with advanced COPD. At this point he is very tachypneic and tachycardic. Will maintain him on BiPAP. Due to COPD exacerbation, will treat him with IV Solu-Medrol, nebulizers with Xopenex. In light of his heart rate being elevated, I will place him on IV Levaquin. I have discussed the case  with Dr. Meredeth IdeFleming who will come see the patient. Will have to monitor him in the ICU at this point.  2. Coronary artery disease with elevated troponin. At this time will place him on aspirin. Will check serial enzymes. Likely due to respiratory distress. Will check echocardiogram. Follow enzymes. Will ask Cardiology to see.  3. Anxiety disorder. Continue Xanax as taking at home.  4. Hyperlipidemia. Continue simvastatin.  5. Gastroesophageal reflux disease. Will continue omeprazole.   CODE STATUS: DO NOT RESUSCITATE confirmed with the wife and the patient. He also has a yellow rod in his chart. Will honor his wishes.   TIME SPENT: 45 minutes of critical care.   ____________________________ Lacie ScottsShreyang H. Allena KatzPatel, MD shp:drc D: 05/22/2011 13:35:21 ET T: 05/22/2011 13:53:33 ET JOB#: [id number]  cc: Britlee Skolnik H. Allena KatzPatel, MD, <Dictator> Dr. Dala DockGlenn West  Charise CarwinSHREYANG H Samba Cumba MD ELECTRONICALLY SIGNED 05/30/2011 16:34

## 2014-06-10 NOTE — Discharge Summary (Signed)
PATIENT NAME:  Eugene Espinoza, Eugene Espinoza MR#:  [id number] DATE OF BIRTH:  Jan 02, 1932  DATE OF ADMISSION:  05/07/2011 DATE OF DISCHARGE:  05/11/2011  DIAGNOSES:  1. Acute on chronic respiratory failure secondary to chronic obstructive pulmonary disease exacerbation. 2. Severe bullous emphysema. 3. Chronic respiratory failure. Patient is on home oxygen 2 to 4 liters. 4. Hypertension. 5. Anxiety. 6. History of coronary artery disease, status post stenting.  7. Hyperlipidemia. 8. Leukocytosis.  9. Frequent falls at home with adult failure to thrive.  10. History of peptic ulcer disease on proton pump inhibitor.  11. Hyperglycemia.  12. Thrombocytopenia.   DISPOSITION: Patient is being discharged to rehab facility.   DIET: Regular.   ACTIVITY: As tolerated. Continue PT at the rehab facility.   FOLLOW UP: Follow up with primary care physician, Dr. Barry BrunnerGlenn Willett, and pulmonologist, Dr. Meredeth IdeFleming, in 1 to 2 weeks after discharge.   DISCHARGE MEDICATIONS:  1. Tylenol 650 mg every six hours p.r.n.  2. Zofran ODT 4 mg every six hours p.r.n.  3. DuoNebs q.6 hours p.r.n. shortness of breath.  4. Levaquin 500 mg daily for three days. 5. Prednisone 40 mg daily for five days then 20 mg daily for five days then prednisone 10 mg daily.  6. Xanax 1 mg t.i.d. p.r.n.  7. Paxil 20 mg daily.  8. Simvastatin 20 mg daily.  9. Omeprazole 20 mg daily.  10. Flomax 0.4 mg daily.  11. Aspirin 81 mg daily. 12. Spiriva 18 mcg inhaled daily.  13. Advair 250/50, 1 puff b.i.d.  14. Albuterol CFC 2 puffs as needed. 15. Fish oil 1000 mg daily.  16. Fexofenadine 180 mg daily.  17. Oxygen 2 to 4 liters via nasal cannula.  18. Flonase two sprays each nostril daily.   LABORATORY, DIAGNOSTIC, AND RADIOLOGICAL DATA: Chest x-ray showed severe bullous emphysema. No infiltrate. EKG was sinus tachycardia with right axis deviation. Pulmonary disease pattern. Blood cultures no growth so far. White count 14.9 on admission,  platelets 135 to 141. Normal CBC. Essentially normal complete metabolic panel other than elevated glucose and minimally elevated AST of 43. Normal cardiac enzymes.   HOSPITAL COURSE: Patient is a 81 year old male with past medical history of severe bullous emphysema, chronic respiratory failure on home O2 who presented with acute on chronic respiratory failure secondary to chronic obstructive pulmonary disease exacerbation. Patient was admitted to the hospital and started on Advair, Spiriva nebulizer treatments, empiric antibiotics and steroids. He has been transitioned to oral steroids and will complete a taper. He had leukocytosis likely from bronchitis. His blood cultures have been negative so far. He had some hyperglycemia from the steroids. He has chronic stable thrombocytopenia. He developed some skin tears which have shown no evidence of infection. Patient had frequent falls at home and had adult failure to thrive. He was evaluated by physical therapy who recommended short-term rehab. Patient is a DO NOT RESUSCITATE.   TIME SPENT: 45 minutes.   ____________________________ Darrick MeigsSangeeta Jamia Hoban, MD sp:cms D: 05/11/2011 14:25:27 ET T: 05/11/2011 14:46:35 ET  JOB#: [id number] cc: Darrick MeigsSangeeta Dashonda Bonneau, MD, <Dictator> Jorje GuildGlenn R. Beckey DowningWillett, MD Darrick MeigsSANGEETA Brandyn Thien MD ELECTRONICALLY SIGNED 05/11/2011 16:42

## 2014-06-10 NOTE — H&P (Signed)
PATIENT NAME:  Eugene Espinoza, CRUMBLE MR#:  [id number] DATE OF BIRTH:  December 29, 1931  DATE OF ADMISSION:  05/07/2011  PRIMARY CARE PHYSICIAN: Dr. Barry Brunner  PULMONOLOGIST: Dr. Meredeth Ide   CHIEF COMPLAINT: Generalized weakness and increasing shortness of breath.   HISTORY OF PRESENT ILLNESS: Eugene Espinoza is a 81 year old gentleman with end-stage bullous emphysematous lung disease who is on 4 liters of nasal cannula oxygen comes to the Emergency Room accompanied by his wife after he started having generalized weakness and slipped off his bed two times today with wife unable to get him around and noted to have significant weakness at home. Patient was diagnosed with upper respiratory tract infection last week, was started on p.o. Omnicef, however, he took it for five days and then quit taking it because his stomach started hurting him. No fever. Continues to have increasing shortness of breath. Has been using nebulizer around the clock along with increasing his oxygen use. He was too weak today and hence wife brought patient to Emergency Room via EMS. Currently patient in the Emergency Room appears weak. His chest x-ray shows bullous emphysematous lung disease. No obvious infiltrates noted. He has a white count of 14.7. He is afebrile. He is tachycardic. He is being admitted for further evaluation and management.   PAST MEDICAL HISTORY:  1. Anxiety.  2. Hypercholesterolemia.  3. Gastroesophageal reflux disease.  4. Allergies. 5. Chronic obstructive pulmonary disease, on 4 liters of oxygen. Patient has bullous emphysema.  6. History of coronary artery disease, status post stent in the past.   ALLERGIES: Bactrim, doxycycline, erythromycin.   MEDICATIONS:  1. Nebulizers with Xopenex around the clock.  2. Advair 250/50, 1 puff b.i.d.  3. Albuterol inhaler 2 puffs as needed.  4. Xanax 1 mg 3 times a day as needed.  5. Aspirin 81 mg daily.  6. Fexofenadine 180 mg daily.  7. Fish oil 1000 mg daily.   8. Flomax 0.4 mg daily.  9. Flonase 50 mcg 2 sprays daily.  10. Omeprazole 20 mg daily. 11. Oxygen 2 to 4 liters continuous. 12. Paxil 20 mg daily.  13. Prednisone 10 mg daily.  14. Simvastatin 20 mg daily.  15. Spiriva 1 capsule inhalation daily.   PAST SURGICAL HISTORY:  1. Appendectomy.  2. Tonsillectomy.   SOCIAL HISTORY: Smoked for 35 years. He lives with his wife. No alcohol or drug use.   FAMILY HISTORY: Father had heart disease. Mother had tuberculosis.   REVIEW OF SYSTEMS: CONSTITUTIONAL: Positive for fatigue, weakness. No fever. EYES: No blurred or double vision. No glaucoma. ENT: No tinnitus, ear pain. Positive for upper respiratory infection symptoms. RESPIRATORY: Positive for chronic shortness of breath with wheeze with history of chronic obstructive pulmonary disease. CARDIOVASCULAR: No chest pain, orthopnea, edema. Positive for dyspnea on exertion. GASTROINTESTINAL: No nausea, vomiting, diarrhea, abdominal pain. GENITOURINARY: No dysuria, hematuria. SKIN: No acne, rash. MUSCULOSKELETAL: Positive for arthritis. NEUROLOGICAL: No cerebrovascular accident, transient ischemic attack. PSYCH: Positive for some anxiety. No depression. All other systems reviewed and negative.   PHYSICAL EXAMINATION:  GENERAL: Patient is awake, alert, oriented x3.   VITAL SIGNS: Afebrile, pulse 100, blood pressure 122/68, sats 95% on 4 liters nasal cannula oxygen, respirations 20 per minute.   HEENT: Atraumatic, normocephalic. Pupils are equal, round, and reactive to light and accommodation. Extraocular movements intact. Oral mucosa is moist.   NECK: Supple. No JVD. No carotid bruit.  RESPIRATORY: Decreased breath sounds bilaterally. Patient has chronic respiratory failure, however, appears to be stable at present.  CARDIOVASCULAR: Both the heart sounds are normal. Rate, rhythm is regular. PMI not lateralized. Chest nontender.   EXTREMITIES: Good pedal pulses. Good femoral pulses. No lower  extremity edema.   ABDOMEN: Soft, benign, nontender. No organomegaly. Positive bowel sounds.   NEUROLOGICAL: Grossly intact cranial nerves II through XII. No motor or sensory deficit. Patient is globally weak and deconditioned.   SKIN: Warm and dry.   PSYCH: Patient is awake, alert, oriented x3.   LABORATORY, DIAGNOSTIC AND RADIOLOGICAL DATA: Chest x-ray shows bullous emphysema. Urinalysis negative for urinary tract infection. White count 14.9, hemoglobin and hematocrit 16.4 and 48.8, platelet count 135, glucose 114, BUN 18, creatinine 0.98, sodium 138, potassium 4, chloride 97, bicarbonate 28, calcium 9.5. LFTs within normal limits except SGOT of 43. Troponin 0.02.   ASSESSMENT: 81 year old Eugene Espinoza with:  1. Acute on chronic respiratory failure with upper respiratory infection, congestion and acute bronchitis.  2. Frequent falls at home with no trauma. Adult failure to thrive, debility.   3. Bullous emphysema and chronic respiratory failure with 4 liters oxygen at home with mild flare and chronic obstructive pulmonary disease.  4. Hyperlipidemia.  5. Gastroesophageal reflux disease.  6. Benign prostatic hypertrophy.  7. History of coronary artery disease, status post stent in the past.   PLAN:  1. Admit patient to medical floor.  2. Patient is a FULL CODE.  3. Will start patient on IV Levaquin.  4. Give p.o. prednisone taper.  5. Continue all home medications along with nebulizers and oral inhalers.  6. Physical therapy to evaluate for muscle weakness.  7. Care management for discharge planning.  8. Further work-up according to patient's clinical course. Hospital admission plan was discussed with patient and his wife. Patient wants to be a FULL CODE at present, however, if it comes to prolonging of care then patient would not want it. Patient is a FULL CODE for now.   TIME SPENT: 50 minutes.   ____________________________ Wylie HailSona A. Allena KatzPatel, MD sap:cms D: 05/07/2011 19:17:21  ET T: 05/08/2011 06:13:34 ET JOB#: [id number]  cc: Jayliah Benett A. Allena KatzPatel, MD, <Dictator> Jorje GuildGlenn R. Beckey DowningWillett, MD Herbon E. Meredeth IdeFleming, MD Willow OraSONA A Ismahan Lippman MD ELECTRONICALLY SIGNED 05/25/2011 6:34
